# Patient Record
Sex: Female | Born: 1980 | Race: White | Hispanic: No | Marital: Single | Smoking: Current every day smoker
Health system: Southern US, Community
[De-identification: ages and names within clinical notes are randomized; demographics above are authoritative.]

## PROBLEM LIST (undated history)

## (undated) DIAGNOSIS — N200 Calculus of kidney: Secondary | ICD-10-CM

## (undated) HISTORY — PX: ABDOMINAL HYSTERECTOMY: SHX81

## (undated) HISTORY — PX: FINGER SURGERY: SHX640

---

## 2010-01-11 ENCOUNTER — Emergency Department (HOSPITAL_COMMUNITY): Admission: EM | Admit: 2010-01-11 | Discharge: 2010-01-11 | Payer: Self-pay | Admitting: Emergency Medicine

## 2010-01-22 ENCOUNTER — Emergency Department (HOSPITAL_COMMUNITY): Admission: EM | Admit: 2010-01-22 | Discharge: 2010-01-22 | Payer: Self-pay | Admitting: Emergency Medicine

## 2010-02-01 ENCOUNTER — Emergency Department (HOSPITAL_COMMUNITY): Admission: EM | Admit: 2010-02-01 | Discharge: 2010-02-02 | Payer: Self-pay | Admitting: Emergency Medicine

## 2010-02-10 ENCOUNTER — Ambulatory Visit (HOSPITAL_COMMUNITY): Admission: RE | Admit: 2010-02-10 | Discharge: 2010-02-10 | Payer: Self-pay | Admitting: Plastic Surgery

## 2010-05-01 ENCOUNTER — Emergency Department (HOSPITAL_COMMUNITY): Admission: EM | Admit: 2010-05-01 | Discharge: 2010-05-01 | Payer: Self-pay | Admitting: Emergency Medicine

## 2010-05-28 ENCOUNTER — Emergency Department (HOSPITAL_COMMUNITY): Admission: EM | Admit: 2010-05-28 | Discharge: 2010-05-28 | Payer: Self-pay | Admitting: Emergency Medicine

## 2010-06-14 ENCOUNTER — Emergency Department (HOSPITAL_COMMUNITY)
Admission: EM | Admit: 2010-06-14 | Discharge: 2010-06-14 | Payer: Self-pay | Source: Home / Self Care | Admitting: Emergency Medicine

## 2010-06-24 ENCOUNTER — Emergency Department (HOSPITAL_COMMUNITY): Admission: EM | Admit: 2010-06-24 | Discharge: 2010-06-24 | Payer: Self-pay | Admitting: Emergency Medicine

## 2010-08-04 ENCOUNTER — Emergency Department (HOSPITAL_COMMUNITY)
Admission: EM | Admit: 2010-08-04 | Discharge: 2010-08-04 | Payer: Self-pay | Source: Home / Self Care | Admitting: Emergency Medicine

## 2010-09-20 LAB — CBC
HCT: 39.5 % (ref 36.0–46.0)
Hemoglobin: 13.6 g/dL (ref 12.0–15.0)
MCH: 31.3 pg (ref 26.0–34.0)
MCHC: 34.4 g/dL (ref 30.0–36.0)
MCV: 91 fL (ref 78.0–100.0)
RBC: 4.34 MIL/uL (ref 3.87–5.11)
RDW: 12.5 % (ref 11.5–15.5)
WBC: 6.1 10*3/uL (ref 4.0–10.5)

## 2010-09-20 LAB — BASIC METABOLIC PANEL
BUN: 7 mg/dL (ref 6–23)
Calcium: 9.2 mg/dL (ref 8.4–10.5)
Creatinine, Ser: 0.68 mg/dL (ref 0.4–1.2)
GFR calc non Af Amer: >60 mL/min (ref >60)
Glucose, Bld: 85 mg/dL (ref 70–99)
Potassium: 3.7 mEq/L (ref 3.5–5.1)
Sodium: 137 mEq/L (ref 135–145)

## 2010-09-20 LAB — DIFFERENTIAL
Basophils Absolute: 0 10*3/uL (ref 0.0–0.1)
Basophils Relative: 0 % (ref 0–1)
Eosinophils Absolute: 0 10*3/uL (ref 0.0–0.7)
Eosinophils Relative: 1 % (ref 0–5)
Lymphocytes Relative: 34 % (ref 12–46)
Lymphs Abs: 2.1 10*3/uL (ref 0.7–4.0)
Monocytes Absolute: 0.3 10*3/uL (ref 0.1–1.0)
Monocytes Relative: 6 % (ref 3–12)
Neutro Abs: 3.6 10*3/uL (ref 1.7–7.7)
Neutrophils Relative %: 59 % (ref 43–77)

## 2010-09-20 LAB — SURGICAL PCR SCREEN: Staphylococcus aureus: NEGATIVE

## 2010-09-22 ENCOUNTER — Ambulatory Visit (HOSPITAL_COMMUNITY)
Admission: RE | Admit: 2010-09-22 | Discharge: 2010-09-23 | Payer: Self-pay | Source: Home / Self Care | Attending: Obstetrics & Gynecology | Admitting: Obstetrics & Gynecology

## 2010-09-22 LAB — TYPE AND SCREEN
ABO/RH(D): O NEG
Antibody Screen: NEGATIVE

## 2010-09-22 LAB — PREGNANCY, URINE: Preg Test, Ur: NEGATIVE

## 2010-09-22 LAB — ABO/RH: ABO/RH(D): O NEG

## 2010-09-27 NOTE — Op Note (Signed)
Teresa Hill, Teresa Hill                ACCOUNT NO.:  [id number]  MEDICAL RECORD NO.:  [id number]          PATIENT TYPE:  OIB  LOCATION:  9302                          FACILITY:  [facility]  PHYSICIAN:  Darryl Nestle, MD     DATE OF BIRTH:  28-Jun-1981  DATE OF PROCEDURE:  09/22/2010 DATE OF DISCHARGE:                              OPERATIVE REPORT   PREOPERATIVE DIAGNOSIS:  Symptomatic uterine fibroids.  POSTOPERATIVE DIAGNOSIS:  Symptomatic uterine fibroids.  PROCEDURE:  Laparoscopic supracervical hysterectomy.  SURGEON:  Darryl Nestle, MD  ASSISTANT:  Lendon Colonel, MD  ANESTHESIA:  General endotracheal.  FINDINGS:  Enlarged uterus with fibroids.  Normal ovaries and tubes, normal ureters, normal liver, gallbladder and upper abdomen in general.  SPECIMEN:  Morcellated uterus weight 261 g, sent to Pathology.  ESTIMATED BLOOD LOSS:  50 mL.  IV FLUIDS:  1800 mL LR.  URINE OUTPUT:  100 mL clear.  COMPLICATIONS:  None.  CONDITION:  Stable.  DISPOSITION:  To PACU.  The patient is a 30 year old with symptomatic uterine fibroids.  She desired to have hysterectomy and not myomectomy as she did not desire future childbearing.  She also did not want to have problems with new fibroids I mean in the future should she have had myomectomy.  Risks and complications of surgery including infection, bleeding, damage to internal organs as well as permanent sterilization as well as postoperative delayed complications were reviewed including DVT, infection as well as anesthesia related to complications.  The patient voiced understanding.  She gave informed written consent for surgery as well as possible blood transfusion.  The patient was brought to the operating room with IV running.  She received Ancef 1 g preop.  She underwent general endotracheal anesthesia without difficulty.  She was given dorsal lithotomy position.  Parts were prepped and draped in normal sterile fashion.  Foley  catheter was placed.  Cervix was exposed with 2 lateral vaginal wall retractors.  The anterior lip of the cervix was grasped with tenaculum and uterus was sounded to 11-cm.  The cervical os was dilated.  The RUMI uterine manipulator a #10 with a KOH ring small that was assembled together on the RUMI manipulator and introduced in the uterine cavity without any complications. Tenaculum was removed and vaginal wall retractors were removed.  Gloves were changed.  Attention was focused on the abdomen.  A 3 mL of 0.25% Marcaine was injected in the upper fold of the umbilicus and a 10-mm incision was made with scalpel.  It was carried down to the underlying fascia which was grasped with 2 Kochers and excised in the midline with scissors and extended superiorly.  The peritoneal entry was made with blunt and sharp dissection and a pursestring suture was taken on the fascia with 0 Vicryl.  A Hasson cannula was introduced.  The balloon was inflated, secured in place and pneumo-insufflation was begun.  A 0-degree laparoscope was introduced and the abdomen was evaluated.  There was no injury at the entry site and overall the bowels appeared normal.  Liver and gallbladder normal.  In the pelvis, uterus was enlarged and coming  out of the pelvic brim.  With manipulating the uterus, both the ovaries and ureters were identified very easily and appeared normal.  There was a peritoneal window lateral to the ureter on the right side.  However, that did not change the path of the ureter. Two incisions were made 5-mm each right and left lower quadrant under visualization after injecting Marcaine and making incisions 5-mm long. The laparoscopic trocars were introduced under visualization, without any complications.  The patient was in Trendelenburg position and surgery was begun.  Uterus was manipulated to expose the patient's left cornual and round ligament which was grasped, desiccated with gyrus and incised.   The fallopian tube and utero-ovarian pedicle were also grasped, desiccated and incised with gyrus in stepwise fashion with excellent hemostasis.  Posterior leaf of broad ligament was opened up to the level of the KOH cup and anteriorly broad ligament was opened with blunt and sharp dissection using cautery from the gyrus to secure hemostasis to create a bladder flap.  This was done up to half way anteriorly and then the right-sided cornual structures as well as round ligament was exposed with uterine manipulation.  The right round ligament was desiccated and incised.  Anterior broad ligament was continued with sharp dissection and using cautery for hemostasis to meet the opposite end to create a bladder flap.  Posteriorly, the right utero- ovarian ligament and right fallopian tube were desiccated including the broad ligament and excised and posterior broad ligament was opened up with sharp dissection until the level of the KOH ring.  The patient had a very large posterior fundal posterior myoma which made visualization of posterior part of the uterus as well as cervix difficult, but with adequate manipulation it was seen well.  The left uterine vessels were now desiccated after skeletonization and after adequate cauterization was done.  The right uterine vessels were exposed by manipulating the uterus the opposite way.  The right uterine vessels were then desiccated in stepwise fashion and incised above the level of KOH ring.  The uterus now appeared well devascularized, so decision was made to proceed with incision of uterine vessels and then proceeding with supracervical hysterectomy.  The gyrus spatula was used for excising the uterus at the level of uterocervical junction.  The spatula was used from the right side and extended the incision towards the left and the remaining was finished up from the left side.  There was adequate visualization. There was no injuries to bowel as it was  securely moved out of the surgical field.  The cervical stump was cauterized and hemostasis was excellent.  The uterine manipulator balloon was deflated and uterine manipulator including the KOH ring was removed from the patient.  Uterus was now dropped in the pelvic cavity.  The left 5-mm trocar was removed. Incision was extended to 10-mm and the Ethicon morcellator was introduced in the abdomen under visualization.  The morcellation of uterus was completed in stepwise fashion without any complications and fragments of morcellated uterus was sent to Pathology.  The weight was 261 g in the operating room.  Pneumoperitoneum was deflated and the hemostasis was reevaluated which was excellent.  There was normal peristalsis of ureter on both the sides.  The patient was taken out of Trendelenburg position.  The small amount of blood that was collected in the pelvis was aspirated with a syringe, with a blunt aspirator and 60 mL of saline was introduced to the same into the abdomen for a wash which was  aspirated as well.  The left lateral port was removed through which the morcellator was present and the fascia was sutured closed with 0 Vicryl.  Skin was approximated using 4-0 Vicryl.  The right lower quadrant trocar was removed under visualization.  Pneumoperitoneum was deflated.  Hasson cannula was removed under visualization as well.  The stay sutures at this umbilical incision were tied off to have adequate closure of the central port.  All incisions were then sutured using 4-0 Vicryl and Dermabond was applied.  Sterile dressing was given.  The patient was made supine and reversed from anesthesia and brought to the recovery room in stable condition.  No complications.  Surgical findings were discussed with the patient's partner.  The plan to discharge the patient on this evening if she remains stable and is able to ambulate, tolerate diet as well as able to void.  SPECIMEN:  Morcellated  uterus.  COMPLICATIONS:  None.     Darryl Nestle, MD     VM/MEDQ  D:  09/22/2010  T:  09/23/2010  Job:  [id number]  Electronically Signed by Susa Day Trigo Winterbottom  on 09/27/2010 06:31:37 PM

## 2010-09-27 NOTE — Discharge Summary (Signed)
  NAMECHARO, Teresa Hill                ACCOUNT NO.:  [id number]  MEDICAL RECORD NO.:  [id number]          PATIENT TYPE:  OIB  LOCATION:  9302                          FACILITY:  [facility]  PHYSICIAN:  Darryl Nestle, MD     DATE OF BIRTH:  July 22, 1981  DATE OF ADMISSION:  09/22/2010 DATE OF DISCHARGE:  09/23/2010                              DISCHARGE SUMMARY   DIAGNOSES:  Symptomatic uterine fibroids, laparoscopic supracervical hysterectomy.  HOSPITAL COURSE:  The patient had uncomplicated laparoscopic supracervical hysterectomy on September 22, 2010.  She was admitted to the floor for observation and pain management.  The patient was stable.  She had no complaints overnight.  She ambulated, voided, tolerated general diet, and no labs were sent since she had minimal blood loss.  On postop day #1, the patient appeared to be recovering well with all parameters on examination within normal limits.  DISCHARGE DISPOSITION:  Home with family.  DISCHARGE CONDITION:  Stable.  Followup in office with Dr. Juliene Pina in 2 weeks.  Discharge instructions reviewed including warning signs of infection.  Also reviewed surgical findings and postop care.     Darryl Nestle, MD     VM/MEDQ  D:  09/23/2010  T:  09/24/2010  Job:  [id number]  Electronically Signed by Susa Day [name]  on 09/27/2010 06:31:54 PM

## 2010-11-09 LAB — CBC
HCT: 38.3 % (ref 36.0–46.0)
Hemoglobin: 13 g/dL (ref 12.0–15.0)
MCH: 31.1 pg (ref 26.0–34.0)
MCHC: 33.9 g/dL (ref 30.0–36.0)
MCV: 91.6 fL (ref 78.0–100.0)
Platelets: 242 10*3/uL (ref 150–400)
RBC: 4.18 MIL/uL (ref 3.87–5.11)
RDW: 12.9 % (ref 11.5–15.5)
WBC: 6.6 10*3/uL (ref 4.0–10.5)

## 2010-11-09 LAB — URINALYSIS, ROUTINE W REFLEX MICROSCOPIC
Bilirubin Urine: NEGATIVE
Glucose, UA: NEGATIVE mg/dL
Ketones, ur: NEGATIVE mg/dL
Nitrite: NEGATIVE
Protein, ur: NEGATIVE mg/dL
Specific Gravity, Urine: 1.012 (ref 1.005–1.030)
Urobilinogen, UA: 0.2 mg/dL (ref 0.0–1.0)
pH: 6.5 (ref 5.0–8.0)

## 2010-11-09 LAB — URINE CULTURE
Colony Count: NO GROWTH
Culture: NO GROWTH

## 2010-11-09 LAB — COMPREHENSIVE METABOLIC PANEL
ALT: 12 U/L (ref 0–35)
AST: 15 U/L (ref 0–37)
Albumin: 3.6 g/dL (ref 3.5–5.2)
Alkaline Phosphatase: 57 U/L (ref 39–117)
BUN: 8 mg/dL (ref 6–23)
CO2: 24 mEq/L (ref 19–32)
Calcium: 8.8 mg/dL (ref 8.4–10.5)
Chloride: 111 mEq/L (ref 96–112)
Creatinine, Ser: 0.66 mg/dL (ref 0.4–1.2)
GFR calc Af Amer: >60 mL/min (ref >60)
GFR calc non Af Amer: >60 mL/min (ref >60)
Glucose, Bld: 90 mg/dL (ref 70–99)
Potassium: 3.5 mEq/L (ref 3.5–5.1)
Sodium: 141 mEq/L (ref 135–145)
Total Bilirubin: 0.5 mg/dL (ref 0.3–1.2)
Total Protein: 6.1 g/dL (ref 6.0–8.3)

## 2010-11-09 LAB — LIPASE, BLOOD: Lipase: 26 U/L (ref 11–59)

## 2010-11-09 LAB — WET PREP, GENITAL
Clue Cells Wet Prep HPF POC: NONE SEEN
Trich, Wet Prep: NONE SEEN

## 2010-11-09 LAB — URINE MICROSCOPIC-ADD ON

## 2010-11-09 LAB — GC/CHLAMYDIA PROBE AMP, GENITAL
Chlamydia, DNA Probe: NEGATIVE
GC Probe Amp, Genital: NEGATIVE

## 2010-11-09 LAB — RPR: RPR Ser Ql: NONREACTIVE

## 2010-11-10 LAB — URINALYSIS, ROUTINE W REFLEX MICROSCOPIC
Bilirubin Urine: NEGATIVE
Hgb urine dipstick: NEGATIVE
Nitrite: NEGATIVE
Nitrite: NEGATIVE
Protein, ur: NEGATIVE mg/dL
Specific Gravity, Urine: 1.01 (ref 1.005–1.030)
Specific Gravity, Urine: 1.025 (ref 1.005–1.030)
Urobilinogen, UA: 0.2 mg/dL (ref 0.0–1.0)
Urobilinogen, UA: 0.2 mg/dL (ref 0.0–1.0)
pH: 6 (ref 5.0–8.0)

## 2010-11-10 LAB — URINE MICROSCOPIC-ADD ON

## 2010-11-10 LAB — BASIC METABOLIC PANEL
BUN: 8 mg/dL (ref 6–23)
Chloride: 108 mEq/L (ref 96–112)
GFR calc non Af Amer: >60 mL/min (ref >60)
Glucose, Bld: 89 mg/dL (ref 70–99)
Potassium: 4 mEq/L (ref 3.5–5.1)
Sodium: 138 mEq/L (ref 135–145)

## 2010-11-10 LAB — PREGNANCY, URINE: Preg Test, Ur: NEGATIVE

## 2010-11-14 LAB — CBC
HCT: 39.6 % (ref 36.0–46.0)
Hemoglobin: 13.6 g/dL (ref 12.0–15.0)
MCHC: 34.5 g/dL (ref 30.0–36.0)
MCV: 95 fL (ref 78.0–100.0)
Platelets: 192 10*3/uL (ref 150–400)
RBC: 4.17 MIL/uL (ref 3.87–5.11)
RDW: 13.1 % (ref 11.5–15.5)
WBC: 7.1 10*3/uL (ref 4.0–10.5)

## 2010-11-15 LAB — COMPREHENSIVE METABOLIC PANEL
ALT: 22 U/L (ref 0–35)
Albumin: 3.6 g/dL (ref 3.5–5.2)
Calcium: 8.9 mg/dL (ref 8.4–10.5)
GFR calc Af Amer: >60 mL/min (ref >60)
Glucose, Bld: 85 mg/dL (ref 70–99)
Sodium: 141 mEq/L (ref 135–145)
Total Protein: 6.5 g/dL (ref 6.0–8.3)

## 2010-11-15 LAB — CBC
MCHC: 33.9 g/dL (ref 30.0–36.0)
Platelets: 241 10*3/uL (ref 150–400)
RDW: 12.9 % (ref 11.5–15.5)

## 2010-11-15 LAB — URINALYSIS, ROUTINE W REFLEX MICROSCOPIC
Glucose, UA: NEGATIVE mg/dL
Leukocytes, UA: NEGATIVE
pH: 7.5 (ref 5.0–8.0)

## 2010-11-15 LAB — URINE MICROSCOPIC-ADD ON

## 2010-11-15 LAB — DIFFERENTIAL
Eosinophils Absolute: 0 10*3/uL (ref 0.0–0.7)
Lymphs Abs: 1.5 10*3/uL (ref 0.7–4.0)
Monocytes Absolute: 0.3 10*3/uL (ref 0.1–1.0)
Monocytes Relative: 3 % (ref 3–12)
Neutrophils Relative %: 78 % — ABNORMAL HIGH (ref 43–77)

## 2010-11-15 LAB — POCT PREGNANCY, URINE: Preg Test, Ur: NEGATIVE

## 2010-12-09 ENCOUNTER — Emergency Department (HOSPITAL_COMMUNITY)
Admission: EM | Admit: 2010-12-09 | Discharge: 2010-12-09 | Disposition: A | Discharge status: Home / Self Care | Payer: Self-pay | Attending: Emergency Medicine | Admitting: Emergency Medicine

## 2010-12-09 DIAGNOSIS — A499 Bacterial infection, unspecified: Secondary | ICD-10-CM | POA: Insufficient documentation

## 2010-12-09 DIAGNOSIS — R112 Nausea with vomiting, unspecified: Secondary | ICD-10-CM | POA: Insufficient documentation

## 2010-12-09 DIAGNOSIS — B9689 Other specified bacterial agents as the cause of diseases classified elsewhere: Secondary | ICD-10-CM | POA: Insufficient documentation

## 2010-12-09 DIAGNOSIS — R109 Unspecified abdominal pain: Secondary | ICD-10-CM | POA: Insufficient documentation

## 2010-12-09 DIAGNOSIS — N9489 Other specified conditions associated with female genital organs and menstrual cycle: Secondary | ICD-10-CM | POA: Insufficient documentation

## 2010-12-09 DIAGNOSIS — N760 Acute vaginitis: Secondary | ICD-10-CM | POA: Insufficient documentation

## 2010-12-09 DIAGNOSIS — F17200 Nicotine dependence, unspecified, uncomplicated: Secondary | ICD-10-CM | POA: Insufficient documentation

## 2010-12-09 LAB — CBC
HCT: 42.4 % (ref 36.0–46.0)
MCV: 92.2 fL (ref 78.0–100.0)
RBC: 4.6 MIL/uL (ref 3.87–5.11)
WBC: 7.8 10*3/uL (ref 4.0–10.5)

## 2010-12-09 LAB — COMPREHENSIVE METABOLIC PANEL
Alkaline Phosphatase: 61 U/L (ref 39–117)
BUN: 7 mg/dL (ref 6–23)
CO2: 24 mEq/L (ref 19–32)
Chloride: 101 mEq/L (ref 96–112)
Glucose, Bld: 87 mg/dL (ref 70–99)
Potassium: 3.9 mEq/L (ref 3.5–5.1)
Total Bilirubin: 0.5 mg/dL (ref 0.3–1.2)

## 2010-12-09 LAB — URINALYSIS, ROUTINE W REFLEX MICROSCOPIC
Glucose, UA: NEGATIVE mg/dL
Hgb urine dipstick: NEGATIVE
Ketones, ur: NEGATIVE mg/dL
pH: 6.5 (ref 5.0–8.0)

## 2010-12-09 LAB — DIFFERENTIAL
Lymphocytes Relative: 28 % (ref 12–46)
Lymphs Abs: 2.2 10*3/uL (ref 0.7–4.0)
Neutrophils Relative %: 67 % (ref 43–77)

## 2010-12-09 LAB — POCT PREGNANCY, URINE: Preg Test, Ur: NEGATIVE

## 2010-12-09 LAB — WET PREP, GENITAL: Yeast Wet Prep HPF POC: NONE SEEN

## 2010-12-11 LAB — GC/CHLAMYDIA PROBE AMP, GENITAL: GC Probe Amp, Genital: NEGATIVE

## 2011-03-07 ENCOUNTER — Emergency Department (HOSPITAL_COMMUNITY)
Admission: EM | Admit: 2011-03-07 | Discharge: 2011-03-07 | Disposition: A | Discharge status: Home / Self Care | Payer: 59 | Attending: Emergency Medicine | Admitting: Emergency Medicine

## 2011-03-07 ENCOUNTER — Emergency Department (HOSPITAL_COMMUNITY): Payer: 59

## 2011-03-07 DIAGNOSIS — F17200 Nicotine dependence, unspecified, uncomplicated: Secondary | ICD-10-CM | POA: Insufficient documentation

## 2011-03-07 DIAGNOSIS — N949 Unspecified condition associated with female genital organs and menstrual cycle: Secondary | ICD-10-CM | POA: Insufficient documentation

## 2011-03-07 DIAGNOSIS — R1031 Right lower quadrant pain: Secondary | ICD-10-CM

## 2011-03-07 HISTORY — DX: Calculus of kidney: N20.0

## 2011-03-07 LAB — URINALYSIS, ROUTINE W REFLEX MICROSCOPIC
Glucose, UA: NEGATIVE mg/dL
Ketones, ur: NEGATIVE mg/dL
Leukocytes, UA: NEGATIVE
pH: 6 (ref 5.0–8.0)

## 2011-03-07 LAB — COMPREHENSIVE METABOLIC PANEL
AST: 15 U/L (ref 0–37)
Albumin: 3.7 g/dL (ref 3.5–5.2)
Chloride: 108 mEq/L (ref 96–112)
Creatinine, Ser: 0.62 mg/dL (ref 0.50–1.10)
Sodium: 138 mEq/L (ref 135–145)
Total Bilirubin: 0.3 mg/dL (ref 0.3–1.2)

## 2011-03-07 LAB — CBC
HCT: 39.5 % (ref 36.0–46.0)
MCHC: 34.9 g/dL (ref 30.0–36.0)
Platelets: 217 10*3/uL (ref 150–400)
RDW: 12.7 % (ref 11.5–15.5)

## 2011-03-07 LAB — DIFFERENTIAL
Basophils Absolute: 0 10*3/uL (ref 0.0–0.1)
Basophils Relative: 0 % (ref 0–1)
Monocytes Absolute: 0.3 10*3/uL (ref 0.1–1.0)
Neutro Abs: 3.4 10*3/uL (ref 1.7–7.7)
Neutrophils Relative %: 59 % (ref 43–77)

## 2011-03-07 MED ORDER — SODIUM CHLORIDE 0.9 % IV SOLN
999.0000 mL | INTRAVENOUS | Status: DC
  Administered 2011-03-07: 999 mL via INTRAVENOUS

## 2011-03-07 MED ORDER — ONDANSETRON HCL 4 MG/2ML IJ SOLN
4.0000 mg | Freq: Once | INTRAMUSCULAR | Status: AC
  Administered 2011-03-07: 4 mg via INTRAVENOUS
  Filled 2011-03-07: qty 2

## 2011-03-07 MED ORDER — HYDROCODONE-ACETAMINOPHEN 5-500 MG PO TABS: 1.0000 | ORAL_TABLET | Freq: Four times a day (QID) | ORAL | Status: AC | PRN

## 2011-03-07 MED ORDER — HYDROMORPHONE HCL 1 MG/ML IJ SOLN
1.0000 mg | Freq: Once | INTRAMUSCULAR | Status: AC
  Administered 2011-03-07: 1 mg via INTRAVENOUS
  Filled 2011-03-07: qty 1

## 2011-03-07 MED ORDER — IBUPROFEN 100 MG/5ML PO SUSP: 800.0000 mg | Freq: Three times a day (TID) | ORAL | Status: DC

## 2011-03-07 MED ORDER — IBUPROFEN 800 MG PO TABS: 800.0000 mg | ORAL_TABLET | Freq: Three times a day (TID) | ORAL | Status: AC

## 2011-03-07 MED ORDER — HYDROCODONE-ACETAMINOPHEN 7.5-500 MG/15ML PO SOLN: 10.0000 mL | Freq: Four times a day (QID) | ORAL | Status: DC | PRN

## 2011-03-07 NOTE — ED Notes (Signed)
Pt presents with right sided abdominal and pelvic pain. Pt recently had partial Hysterectomy. No relief from pain with surg.

## 2011-03-07 NOTE — ED Provider Notes (Signed)
History     Chief Complaint  Patient presents with  . Abdominal Pain  . Pelvic Pain   Patient is a 30 y.o. female presenting with abdominal pain and pelvic pain.  Abdominal Pain The primary symptoms of the illness include abdominal pain.  Pelvic Pain Associated symptoms include abdominal pain.    Past Medical History  Diagnosis Date  . Kidney stone     Past Surgical History  Procedure Date  . Abdominal hysterectomy     History reviewed. No pertinent family history.  History  Substance Use Topics  . Smoking status: Current Everyday Smoker -- 1.0 packs/day for 10 years    Types: Cigarettes  . Smokeless tobacco: Not on file  . Alcohol Use: No    OB History    Grav Para Term Preterm Abortions TAB SAB Ect Mult Living                  Review of Systems  Gastrointestinal: Positive for abdominal pain.  Genitourinary: Positive for pelvic pain.    Physical Exam  BP 115/76  Pulse 101  Temp(Src) 98.3 F (36.8 C) (Oral)  Resp 16  Ht 5\' 5"  (1.651 m)  Wt 157 lb (71.215 kg)  BMI 26.13 kg/m2  SpO2 100%  Physical Exam  ED Course  Procedures  MDM PT care assumed and PT evaluated for mild recurrent persistent RLQ pain s/p hysto for the same symptoms, CT reviewed and labs pending.  11:18 AM  Labs reviewed and serial exams performed, no peritonitis. No clinical appendicitis/ TOA/ surgical abdomen.  Plan GI referral for further work up. PT requesting to be discharged home. She states understanding strict d/c and f/u instructions as well as return precautions.      Sunnie Nielsen, MD 03/07/11 [phone]

## 2011-03-07 NOTE — ED Provider Notes (Addendum)
History     Chief Complaint  Patient presents with  . Abdominal Pain  . Pelvic Pain   HPI Comments: pt complains of right lower abd pain since her hysterectomy in feb.  Pt states her gyn md states this is not a gyn problem.   Patient is a 30 y.o. female presenting with abdominal pain and pelvic pain. The history is provided by the patient.  Abdominal Pain The primary symptoms of the illness include abdominal pain. The primary symptoms of the illness do not include fever, fatigue, nausea, vomiting, diarrhea, dysuria or vaginal bleeding. The current episode started more than 2 days ago. The onset of the illness was gradual. The problem has not changed since onset. The abdominal pain began more than 2 days ago. The pain came on gradually. The abdominal pain is located in the RLQ. The abdominal pain radiates to the RLQ. The severity of the abdominal pain is 4/10. The abdominal pain is relieved by nothing.  The illness is associated with eating. The patient states that she believes she is currently not pregnant. The patient has not had a change in bowel habit. Symptoms associated with the illness do not include constipation, hematuria, frequency or back pain. Significant associated medical issues do not include GERD.  Pelvic Pain Associated symptoms include abdominal pain. Pertinent negatives include no chest pain and no headaches.   Past Medical History  Diagnosis Date  . Kidney stone     Past Surgical History  Procedure Date  . Abdominal hysterectomy     History reviewed. No pertinent family history.  History  Substance Use Topics  . Smoking status: Current Everyday Smoker -- 1.0 packs/day for 10 years    Types: Cigarettes  . Smokeless tobacco: Not on file  . Alcohol Use: No    OB History    Grav Para Term Preterm Abortions TAB SAB Ect Mult Living                  Review of Systems  Constitutional: Negative for fever and fatigue.  HENT: Negative for congestion, sinus  pressure and ear discharge.   Eyes: Negative for discharge.  Respiratory: Negative for cough.   Cardiovascular: Negative for chest pain.  Gastrointestinal: Positive for abdominal pain. Negative for nausea, vomiting, diarrhea, constipation, abdominal distention and anal bleeding.  Genitourinary: Positive for pelvic pain. Negative for dysuria, frequency, hematuria and vaginal bleeding.  Musculoskeletal: Negative for back pain.  Skin: Negative for rash.  Neurological: Negative for seizures and headaches.  Hematological: Negative.   Psychiatric/Behavioral: Negative for hallucinations.    Physical Exam  BP 115/76  Pulse 101  Temp(Src) 98.3 F (36.8 C) (Oral)  Resp 16  Ht 5\' 5"  (1.651 m)  Wt 157 lb (71.215 kg)  BMI 26.13 kg/m2  SpO2 100%  Physical Exam  Constitutional: She is oriented to person, place, and time. She appears well-developed.  HENT:  Head: Normocephalic and atraumatic.  Eyes: Conjunctivae and EOM are normal.  Neck: Neck supple. No thyromegaly present.  Cardiovascular: Normal rate and regular rhythm.  Exam reveals no gallop and no friction rub.   No murmur heard. Pulmonary/Chest: No stridor. She has no wheezes. She has no rales. She exhibits no tenderness.  Abdominal: She exhibits no distension. There is tenderness. There is no rebound.    Musculoskeletal: Normal range of motion. She exhibits no edema.  Lymphadenopathy:    She has no cervical adenopathy.  Neurological: She is oriented to person, place, and time. Coordination normal.  Skin:  No rash noted. No erythema.  Psychiatric: She has a normal mood and affect. Her behavior is normal.    ED Course  Procedures  MDM abd pain with normal ct.  Pt transferred to Dr. Donnal Moat, MD 03/07/11 1114  Benny Lennert, MD 03/25/11 [phone]

## 2011-07-27 ENCOUNTER — Emergency Department (HOSPITAL_COMMUNITY)
Admission: EM | Admit: 2011-07-27 | Discharge: 2011-07-28 | Disposition: A | Discharge status: Home / Self Care | Payer: 59 | Attending: Emergency Medicine | Admitting: Emergency Medicine

## 2011-07-27 ENCOUNTER — Encounter (HOSPITAL_COMMUNITY): Payer: Self-pay | Admitting: *Deleted

## 2011-07-27 DIAGNOSIS — R1031 Right lower quadrant pain: Secondary | ICD-10-CM | POA: Insufficient documentation

## 2011-07-27 DIAGNOSIS — F17200 Nicotine dependence, unspecified, uncomplicated: Secondary | ICD-10-CM | POA: Insufficient documentation

## 2011-07-27 DIAGNOSIS — Z87442 Personal history of urinary calculi: Secondary | ICD-10-CM | POA: Insufficient documentation

## 2011-07-27 DIAGNOSIS — K625 Hemorrhage of anus and rectum: Secondary | ICD-10-CM | POA: Insufficient documentation

## 2011-07-27 NOTE — ED Notes (Signed)
Pt being seen by gastro dr for the last month in [place]. Pt reports increased pain today on the rlq, w/ dark stools. Pt noted dark red when wiped also. Called pcp & was unable to see & was advised to come the ER.

## 2011-07-28 LAB — POCT I-STAT, CHEM 8
BUN: 11 mg/dL (ref 6–23)
Calcium, Ion: 1.16 mmol/L (ref 1.12–1.32)
Chloride: 106 mEq/L (ref 96–112)
Creatinine, Ser: 0.7 mg/dL (ref 0.50–1.10)
Glucose, Bld: 93 mg/dL (ref 70–99)
HCT: 40 % (ref 36.0–46.0)
Hemoglobin: 13.6 g/dL (ref 12.0–15.0)
Potassium: 3.4 mEq/L — ABNORMAL LOW (ref 3.5–5.1)
Sodium: 141 mEq/L (ref 135–145)
TCO2: 21 mmol/L (ref 0–100)

## 2011-07-28 LAB — URINALYSIS, ROUTINE W REFLEX MICROSCOPIC
Bilirubin Urine: NEGATIVE
Glucose, UA: NEGATIVE mg/dL
Ketones, ur: NEGATIVE mg/dL
Leukocytes, UA: NEGATIVE
Nitrite: NEGATIVE
Protein, ur: NEGATIVE mg/dL
Specific Gravity, Urine: >1.03 — ABNORMAL HIGH (ref 1.005–1.030)
Urobilinogen, UA: 0.2 mg/dL (ref 0.0–1.0)
pH: 6 (ref 5.0–8.0)

## 2011-07-28 LAB — OCCULT BLOOD, POC DEVICE: Fecal Occult Bld: NEGATIVE

## 2011-07-28 LAB — URINE MICROSCOPIC-ADD ON

## 2011-07-28 MED ORDER — OXYCODONE-ACETAMINOPHEN 5-325 MG PO TABS
1.0000 | ORAL_TABLET | Freq: Once | ORAL | Status: AC
  Administered 2011-07-28: 1 via ORAL
  Filled 2011-07-28: qty 1

## 2011-07-28 NOTE — ED Notes (Signed)
Pt DC to home with steady gait 

## 2011-08-01 NOTE — ED Provider Notes (Signed)
History    30yF with abdominal pain. Has been chronic for over 2 months. Has been seeing GI doctor and considering possible crohn's. Pt is supposed to be getting CT and colonoscopy. Pain in RLQ. No radiation. Gradual onset. Initially intermittent and now more or less constant. Today with small amount or BRBPR when wiped. No urinary complaints. No melena. Denies hx of significant NSAID use.   CSN: [id number] Arrival date & time: 07/27/2011 11:34 PM   First MD Initiated Contact with Patient 07/28/11 0017      Chief Complaint  Patient presents with  . Abdominal Pain    (Consider location/radiation/quality/duration/timing/severity/associated sxs/prior treatment) HPI  Past Medical History  Diagnosis Date  . Kidney stone   . Abdominal pain     Past Surgical History  Procedure Date  . Abdominal hysterectomy   . Finger surgery     History reviewed. No pertinent family history.  History  Substance Use Topics  . Smoking status: Current Everyday Smoker -- 1.0 packs/day for 10 years    Types: Cigarettes  . Smokeless tobacco: Not on file  . Alcohol Use: No    OB History    Grav Para Term Preterm Abortions TAB SAB Ect Mult Living                  Review of Systems   Review of symptoms negative unless otherwise noted in HPI.   Allergies  Benadryl and Sulfa antibiotics  Home Medications   Current Outpatient Rx  Name Route Sig Dispense Refill  . OMEPRAZOLE 20 MG PO CPDR Oral Take 20 mg by mouth 2 (two) times daily.        BP 105/63  Pulse 92  Temp(Src) 98.4 F (36.9 C) (Oral)  Resp 20  Ht 5\' 5"  (1.651 m)  Wt 152 lb (68.947 kg)  BMI 25.29 kg/m2  SpO2 100%  Physical Exam  Nursing note and vitals reviewed. Constitutional: She appears well-developed and well-nourished. No distress.  HENT:  Head: Normocephalic and atraumatic.  Eyes: Conjunctivae are normal. Right eye exhibits no discharge. Left eye exhibits no discharge.  Neck: Neck supple.  Cardiovascular:  Normal rate, regular rhythm and normal heart sounds.  Exam reveals no gallop and no friction rub.   No murmur heard. Pulmonary/Chest: Effort normal and breath sounds normal. No respiratory distress.  Abdominal: Soft. She exhibits no distension. There is tenderness.       Mild tenderness in RLQ without reboudn or gurading. No distension.  Genitourinary: Guaiac negative stool.       No cva tenderness. Rectal unremarkable. No external lesions. No internal masses felt. Soft brown stool. Hemoccult negative.  Musculoskeletal: She exhibits no edema and no tenderness.  Neurological: She is alert.  Skin: Skin is warm and dry.  Psychiatric: She has a normal mood and affect. Her behavior is normal. Thought content normal.    ED Course  Procedures (including critical care time)  Labs Reviewed  URINALYSIS, ROUTINE W REFLEX MICROSCOPIC - Abnormal; Notable for the following:    Color, Urine AMBER (*) BIOCHEMICALS MAY BE AFFECTED BY COLOR   Specific Gravity, Urine >1.030 (*)    Hgb urine dipstick TRACE (*)    All other components within normal limits  URINE MICROSCOPIC-ADD ON - Abnormal; Notable for the following:    Bacteria, UA FEW (*)    All other components within normal limits  POCT I-STAT, CHEM 8 - Abnormal; Notable for the following:    Potassium 3.4 (*)    All  other components within normal limits  OCCULT BLOOD, POC DEVICE  LAB REPORT - SCANNED   No results found.   1. Abdominal pain   2. Rectal bleeding       MDM  30yF with abdominal pain. Chronic in nature. Doubt utility in further evaluation at this time. Rectal unremarkable. Pt has good GI follow-up.        Raeford Razor, MD 08/03/11 0100

## 2012-06-17 IMAGING — CT CT ABD-PELV W/O CM
2 of 3 series · 9 of 46 positions shown, 11 images · non-contrast
Comparison: 06/14/2010

CLINICAL DATA: Abdominal and low pelvic pain, past history of
hysterectomy

CT ABDOMEN AND PELVIS WITHOUT CONTRAST
TECHNIQUE: Multidetector CT imaging of the abdomen and pelvis was
performed following the standard protocol without intravenous
contrast. Sagittal and coronal MPR images reconstructed from axial
data set.

[Series 4: mpr coronal (id) · coronal · 0.66mm/px · 8 of 72 slices shown, 9 images]
[im 8/72  soft-tissue]
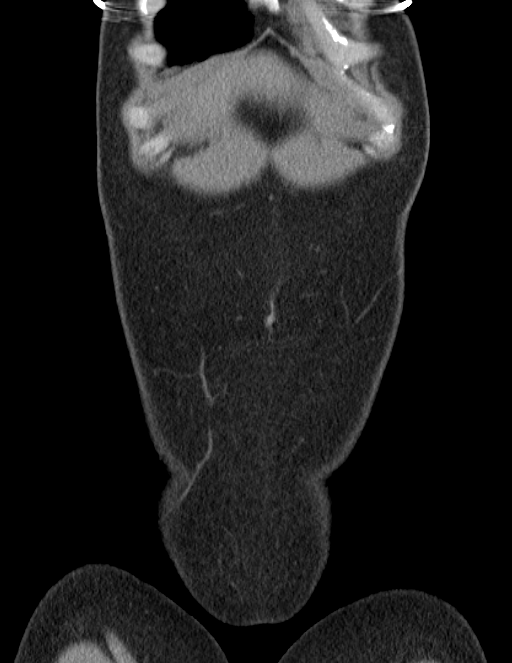
[im 8/72  bone]
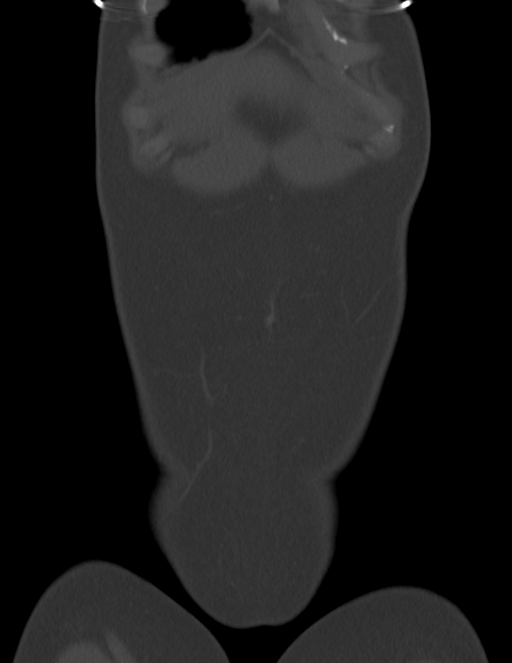
[im 16/72  soft-tissue]
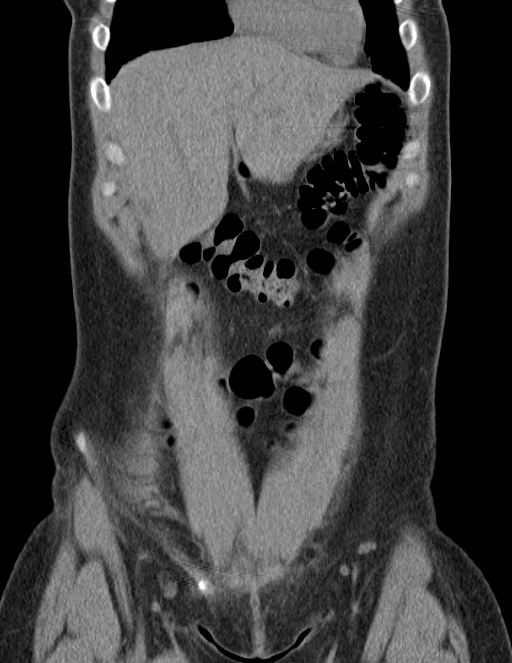
[im 24/72  soft-tissue]
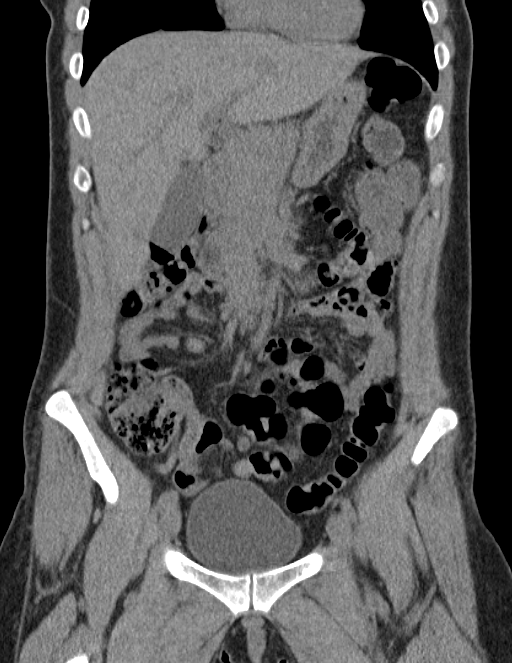
[im 32/72  soft-tissue]
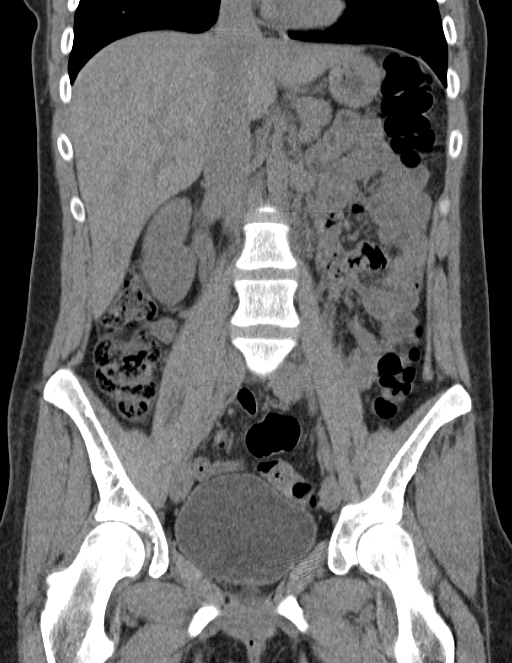
[im 40/72  soft-tissue]
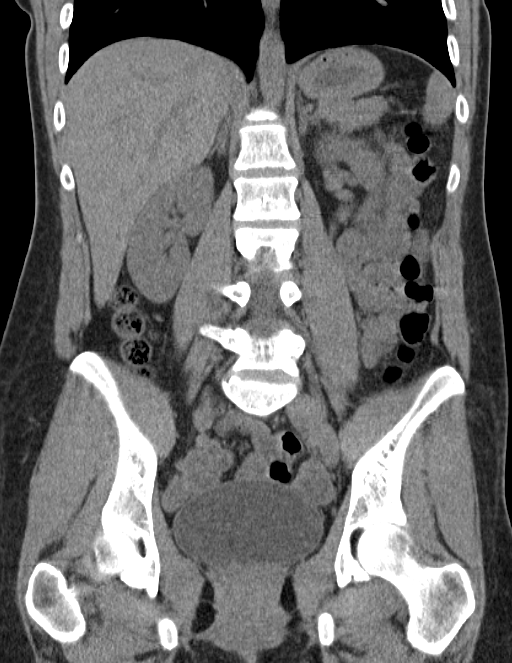
[im 48/72  soft-tissue]
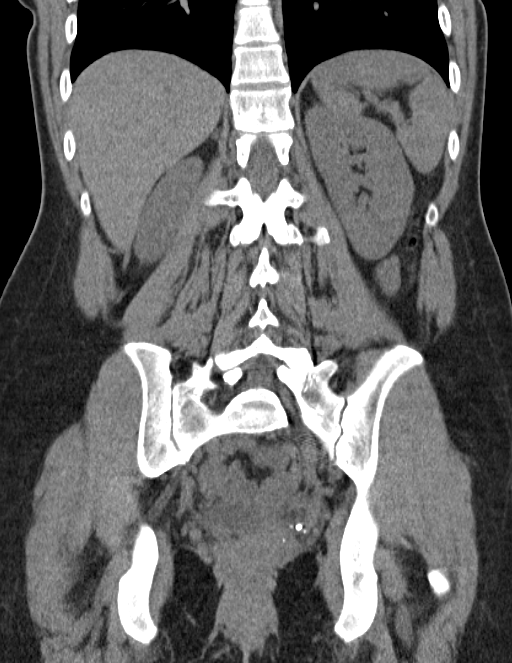
[im 56/72  soft-tissue]
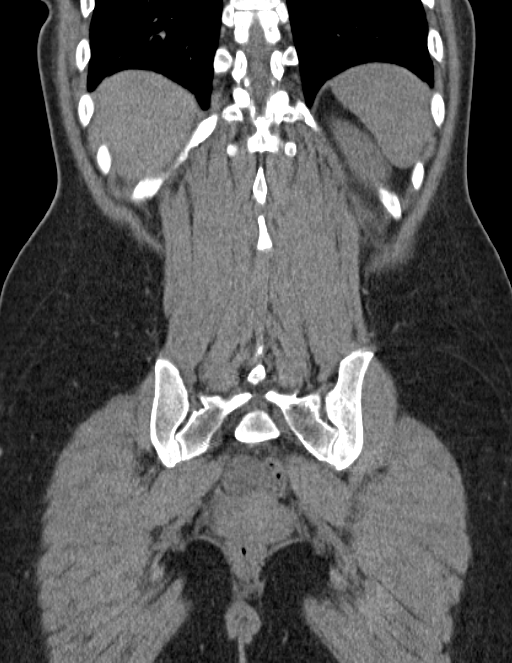
[im 64/72  soft-tissue]
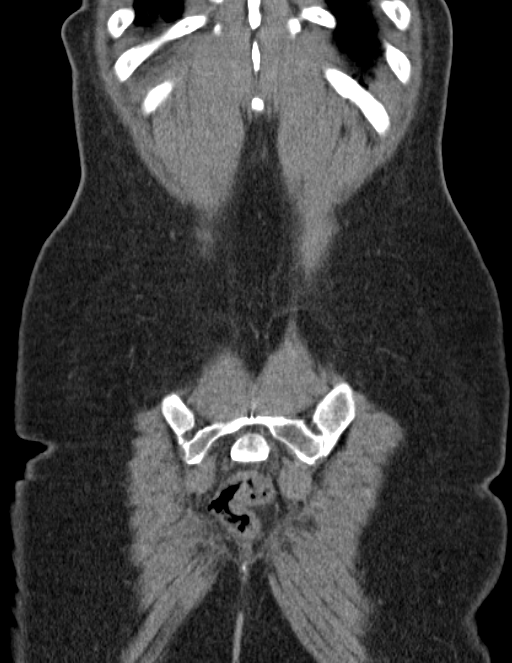

[Series 5: mpr sagittal (id) · sagittal · 0.49mm/px · 1 of 107 slices shown, 2 images]
[im 36/107  soft-tissue]
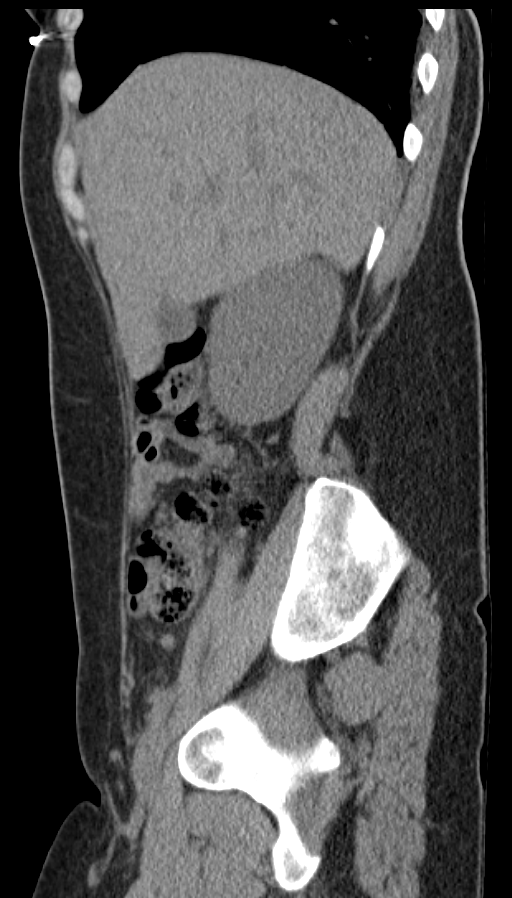
[im 36/107  bone]
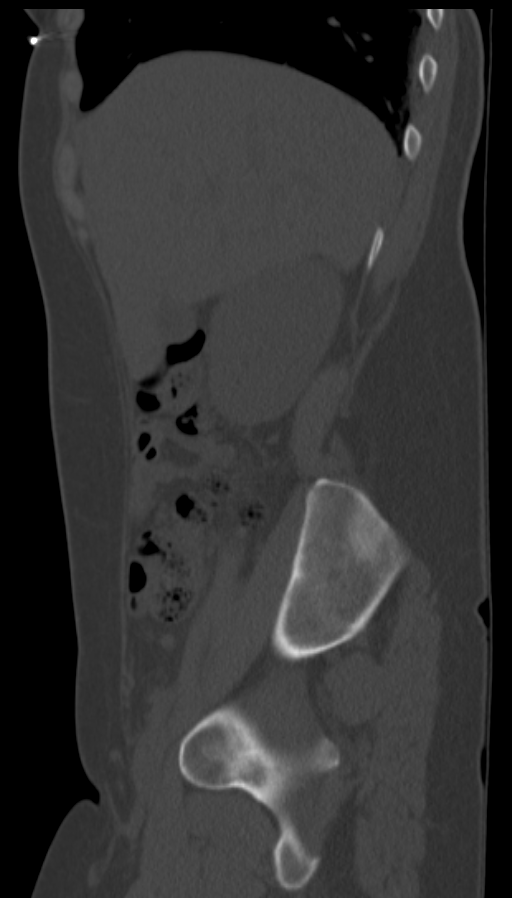

[9 of 46 positions shown; findings below may reference images not displayed]

FINDINGS: Minimal dependent atelectasis at lung bases.
No definite urinary tract calcification or dilatation.
Well-distended normal-appearing bladder.
Numerous pelvic phleboliths bilaterally.
Within limits of a nonenhanced exam, no focal abnormalities of the
liver, spleen, pancreas, kidneys, or adrenal glands.
Few scattered normal-sized mesenteric lymph nodes.
Normal appendix.

Small amount of nonspecific free pelvic fluid.
Unremarkable ovaries.
Soft tissue density at superior aspect of vaginal cuff, question
residual cervix.
Stomach and bowel loops grossly unremarkable for technique.
No mass, adenopathy, hernia or inflammatory process identified.
No acute osseous findings.
IMPRESSION: Small amount of nonspecific free pelvic fluid.
Otherwise negative exam.

## 2016-11-29 ENCOUNTER — Encounter (HOSPITAL_COMMUNITY): Payer: Self-pay | Admitting: Emergency Medicine

## 2016-11-29 ENCOUNTER — Emergency Department (HOSPITAL_COMMUNITY)
Admission: EM | Admit: 2016-11-29 | Discharge: 2016-11-29 | Disposition: A | Discharge status: Home / Self Care | Payer: Self-pay | Attending: Dermatology | Admitting: Dermatology

## 2016-11-29 DIAGNOSIS — Z5321 Procedure and treatment not carried out due to patient leaving prior to being seen by health care provider: Secondary | ICD-10-CM | POA: Insufficient documentation

## 2016-11-29 DIAGNOSIS — R569 Unspecified convulsions: Secondary | ICD-10-CM | POA: Insufficient documentation

## 2016-11-29 LAB — CBC
HCT: 42.5 % (ref 36.0–46.0)
Hemoglobin: 14.7 g/dL (ref 12.0–15.0)
MCH: 31.7 pg (ref 26.0–34.0)
MCHC: 34.6 g/dL (ref 30.0–36.0)
MCV: 91.6 fL (ref 78.0–100.0)
Platelets: 337 K/uL (ref 150–400)
RBC: 4.64 MIL/uL (ref 3.87–5.11)
RDW: 12.3 % (ref 11.5–15.5)
WBC: 7.4 K/uL (ref 4.0–10.5)

## 2016-11-29 LAB — BASIC METABOLIC PANEL WITH GFR
Anion gap: 8 (ref 5–15)
BUN: 6 mg/dL (ref 6–20)
CO2: 25 mmol/L (ref 22–32)
Calcium: 9.4 mg/dL (ref 8.9–10.3)
Chloride: 105 mmol/L (ref 101–111)
Creatinine, Ser: 0.73 mg/dL (ref 0.44–1.00)
GFR calc Af Amer: >60 mL/min
GFR calc non Af Amer: >60 mL/min
Glucose, Bld: 83 mg/dL (ref 65–99)
Potassium: 4.1 mmol/L (ref 3.5–5.1)
Sodium: 138 mmol/L (ref 135–145)

## 2016-11-29 NOTE — ED Notes (Signed)
Patient tearful, worrying about what is wrong. Pt alert and oriented. RN consoled and reassessed patient and neuro is intact.

## 2016-11-29 NOTE — ED Notes (Signed)
Patient's family member came up to nurse's station and stated that they were going to go to her doctor in the morning and do not want to wait any longer. RN apologized for delay and encouraged her to stay. Patient A&O x 4, ambulatory.

## 2016-11-29 NOTE — ED Triage Notes (Signed)
Pt reports that the last thing she remembers was sitting on the couch and she woke up on the floor, suspects she had another seizure, reports she had a seizure last week and was seen at [facility] and told to follow up with a neurologist. Pt not post ictal at this time, a/ox4, resp e/u, nad.
